# Patient Record
Sex: Male | Born: 1955 | Race: Black or African American | Hispanic: No | Marital: Married | Smoking: Never smoker
Health system: Southern US, Community
[De-identification: ages and names within clinical notes are randomized; demographics above are authoritative.]

---

## 2018-08-26 ENCOUNTER — Emergency Department (HOSPITAL_COMMUNITY): Payer: Self-pay

## 2018-08-26 ENCOUNTER — Inpatient Hospital Stay (HOSPITAL_COMMUNITY): Payer: Self-pay

## 2018-08-26 ENCOUNTER — Inpatient Hospital Stay (HOSPITAL_COMMUNITY)
Admission: EM | Admit: 2018-08-26 | Discharge: 2018-08-27 | DRG: 101 | Disposition: A | Discharge status: Home / Self Care | Payer: Self-pay | Attending: Family Medicine | Admitting: Family Medicine

## 2018-08-26 DIAGNOSIS — F09 Unspecified mental disorder due to known physiological condition: Secondary | ICD-10-CM | POA: Diagnosis present

## 2018-08-26 DIAGNOSIS — Z87898 Personal history of other specified conditions: Secondary | ICD-10-CM

## 2018-08-26 DIAGNOSIS — G9389 Other specified disorders of brain: Secondary | ICD-10-CM | POA: Diagnosis present

## 2018-08-26 DIAGNOSIS — R569 Unspecified convulsions: Secondary | ICD-10-CM

## 2018-08-26 DIAGNOSIS — G40909 Epilepsy, unspecified, not intractable, without status epilepticus: Secondary | ICD-10-CM

## 2018-08-26 DIAGNOSIS — G9349 Other encephalopathy: Secondary | ICD-10-CM | POA: Diagnosis present

## 2018-08-26 DIAGNOSIS — U071 COVID-19: Secondary | ICD-10-CM

## 2018-08-26 DIAGNOSIS — Z20828 Contact with and (suspected) exposure to other viral communicable diseases: Secondary | ICD-10-CM | POA: Diagnosis present

## 2018-08-26 DIAGNOSIS — G40409 Other generalized epilepsy and epileptic syndromes, not intractable, without status epilepticus: Principal | ICD-10-CM | POA: Diagnosis present

## 2018-08-26 DIAGNOSIS — W19XXXA Unspecified fall, initial encounter: Secondary | ICD-10-CM | POA: Diagnosis present

## 2018-08-26 DIAGNOSIS — Y92009 Unspecified place in unspecified non-institutional (private) residence as the place of occurrence of the external cause: Secondary | ICD-10-CM

## 2018-08-26 DIAGNOSIS — Z87820 Personal history of traumatic brain injury: Secondary | ICD-10-CM

## 2018-08-26 DIAGNOSIS — N179 Acute kidney failure, unspecified: Secondary | ICD-10-CM | POA: Diagnosis present

## 2018-08-26 LAB — COMPREHENSIVE METABOLIC PANEL
ALT: 18 U/L (ref 0–44)
AST: 36 U/L (ref 15–41)
Albumin: 3.6 g/dL (ref 3.5–5.0)
Alkaline Phosphatase: 47 U/L (ref 38–126)
Anion gap: 18 — ABNORMAL HIGH (ref 5–15)
BUN: 15 mg/dL (ref 6–20)
CO2: 15 mmol/L — ABNORMAL LOW (ref 22–32)
Calcium: 9 mg/dL (ref 8.9–10.3)
Chloride: 109 mmol/L (ref 98–111)
Creatinine, Ser: 1.26 mg/dL — ABNORMAL HIGH (ref 0.61–1.24)
GFR calc Af Amer: >60 mL/min (ref >60)
GFR calc non Af Amer: >60 mL/min (ref >60)
Glucose, Bld: 96 mg/dL (ref 70–99)
Potassium: 3.8 mmol/L (ref 3.5–5.1)
Sodium: 142 mmol/L (ref 135–145)
Total Bilirubin: 1.7 mg/dL — ABNORMAL HIGH (ref 0.3–1.2)
Total Protein: 6.1 g/dL — ABNORMAL LOW (ref 6.5–8.1)

## 2018-08-26 LAB — CBC WITH DIFFERENTIAL/PLATELET
Abs Immature Granulocytes: 0.05 10*3/uL (ref 0.00–0.07)
Basophils Absolute: 0.1 10*3/uL (ref 0.0–0.1)
Basophils Relative: 0 %
Eosinophils Absolute: 0.2 10*3/uL (ref 0.0–0.5)
Eosinophils Relative: 2 %
HCT: 46.5 % (ref 39.0–52.0)
Hemoglobin: 15.2 g/dL (ref 13.0–17.0)
Immature Granulocytes: 0 %
Lymphocytes Relative: 28 %
Lymphs Abs: 3.8 10*3/uL (ref 0.7–4.0)
MCH: 28.6 pg (ref 26.0–34.0)
MCHC: 32.7 g/dL (ref 30.0–36.0)
MCV: 87.6 fL (ref 80.0–100.0)
Monocytes Absolute: 1 10*3/uL (ref 0.1–1.0)
Monocytes Relative: 7 %
Neutro Abs: 8.3 10*3/uL — ABNORMAL HIGH (ref 1.7–7.7)
Neutrophils Relative %: 63 %
Platelets: 230 10*3/uL (ref 150–400)
RBC: 5.31 MIL/uL (ref 4.22–5.81)
RDW: 12.8 % (ref 11.5–15.5)
WBC: 13.4 10*3/uL — ABNORMAL HIGH (ref 4.0–10.5)
nRBC: 0 % (ref 0.0–0.2)

## 2018-08-26 LAB — URINALYSIS, ROUTINE W REFLEX MICROSCOPIC
Bilirubin Urine: NEGATIVE
Glucose, UA: NEGATIVE mg/dL
Ketones, ur: NEGATIVE mg/dL
Leukocytes,Ua: NEGATIVE
Nitrite: NEGATIVE
Protein, ur: NEGATIVE mg/dL
Specific Gravity, Urine: 1.017 (ref 1.005–1.030)
pH: 6 (ref 5.0–8.0)

## 2018-08-26 LAB — RAPID URINE DRUG SCREEN, HOSP PERFORMED
Amphetamines: NOT DETECTED
Barbiturates: NOT DETECTED
Benzodiazepines: NOT DETECTED
Cocaine: NOT DETECTED
Opiates: NOT DETECTED
Tetrahydrocannabinol: NOT DETECTED

## 2018-08-26 LAB — SARS CORONAVIRUS 2 BY RT PCR (HOSPITAL ORDER, PERFORMED IN ~~LOC~~ HOSPITAL LAB): SARS Coronavirus 2: NEGATIVE

## 2018-08-26 LAB — ETHANOL: Alcohol, Ethyl (B): <10 mg/dL (ref <10)

## 2018-08-26 MED ORDER — LORAZEPAM 2 MG/ML IJ SOLN: 2.0000 mg | INTRAMUSCULAR | Status: DC | PRN

## 2018-08-26 MED ORDER — LORAZEPAM 2 MG/ML IJ SOLN
INTRAMUSCULAR | Status: AC
  Administered 2018-08-26: 2 mg
  Filled 2018-08-26: qty 2

## 2018-08-26 MED ORDER — ACETAMINOPHEN 650 MG RE SUPP: 650.0000 mg | RECTAL | Status: DC | PRN

## 2018-08-26 MED ORDER — SODIUM CHLORIDE 0.9 % IV SOLN
INTRAVENOUS | Status: DC
  Administered 2018-08-26: 18:00:00 via INTRAVENOUS

## 2018-08-26 MED ORDER — STROKE: EARLY STAGES OF RECOVERY BOOK
Freq: Once | Status: AC
  Administered 2018-08-26: 22:00:00
  Filled 2018-08-26: qty 1

## 2018-08-26 MED ORDER — ACETAMINOPHEN 325 MG PO TABS: 650.0000 mg | ORAL_TABLET | ORAL | Status: DC | PRN

## 2018-08-26 MED ORDER — LEVETIRACETAM IN NACL 500 MG/100ML IV SOLN
500.0000 mg | Freq: Once | INTRAVENOUS | Status: AC
  Administered 2018-08-26: 500 mg via INTRAVENOUS
  Filled 2018-08-26: qty 100

## 2018-08-26 MED ORDER — LEVETIRACETAM IN NACL 1000 MG/100ML IV SOLN
1000.0000 mg | Freq: Once | INTRAVENOUS | Status: AC
  Administered 2018-08-26: 1000 mg via INTRAVENOUS
  Filled 2018-08-26: qty 100

## 2018-08-26 MED ORDER — LEVETIRACETAM IN NACL 500 MG/100ML IV SOLN: 500.0000 mg | Freq: Two times a day (BID) | INTRAVENOUS | Status: DC

## 2018-08-26 MED ORDER — SODIUM CHLORIDE 0.9 % IV SOLN
750.0000 mg | Freq: Two times a day (BID) | INTRAVENOUS | Status: DC
  Administered 2018-08-26 – 2018-08-27 (×2): 750 mg via INTRAVENOUS
  Filled 2018-08-26 (×3): qty 7.5

## 2018-08-26 MED ORDER — ENOXAPARIN SODIUM 40 MG/0.4ML ~~LOC~~ SOLN
40.0000 mg | SUBCUTANEOUS | Status: DC
  Administered 2018-08-26: 40 mg via SUBCUTANEOUS
  Filled 2018-08-26: qty 0.4

## 2018-08-26 MED ORDER — ACETAMINOPHEN 160 MG/5ML PO SOLN: 650.0000 mg | ORAL | Status: DC | PRN

## 2018-08-26 NOTE — Progress Notes (Addendum)
1625: Patient arrived to room 5W23. Unable to complete assessment due to language barrier. Unable to complete admission due to language barrier. Call to translation services multiple times. Translator unable to hear and or service not answered. Patient states he also speaks [place] but prefers [place].   1630: Unable to complete Neuro Check and NIH.

## 2018-08-26 NOTE — ED Notes (Signed)
Sats dropped to 89% RN in room to find pt having a seizing , 2mg  ativan given , pt rolled to the side and placed on O2

## 2018-08-26 NOTE — ED Notes (Signed)
Pt becoming very agitated when taking away his blanket , MD at bedside assessing patient , once blanket is given back , pt became calm and Is laying in bed calm

## 2018-08-26 NOTE — Progress Notes (Signed)
EEG Completed; Results Pending  
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
## 2018-08-26 NOTE — ED Notes (Signed)
Family states that pt is back to baseline and wants to go home , family had an appointment to go to but left number , pt is resting in bed VSS

## 2018-08-26 NOTE — ED Notes (Signed)
EEG at bedside. Pt requesting tylenol for headache.
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
## 2018-08-26 NOTE — ED Triage Notes (Addendum)
Pt brought in by EMS for c/o possible seizure ; per ems family reported that patient  Carlos Pratt for a walk and after he came back he had a fall in the kitchen , got himself up and laid on the couch and had a "Shaking " episode ; upon ems arrival patient had agonal breathing and assisted ventilations , pt has white sputum in his mouth ; on the way here , patient became post ictal , language barrier present , unknown what language pt speaks , son on way, pt alert and having purposeful movements at this time ; ems vitals : 200 pal. 144 hr afib, cbg 112 , rr 24 98%ra

## 2018-08-26 NOTE — ED Notes (Signed)
Patient is resting comfortably. 

## 2018-08-26 NOTE — Procedures (Signed)
Patient Name: Carlos Pratt  MRN: [id number]  Epilepsy Attending: Lora Havens  Referring Physician/Provider: Dr Amie [place] Date: 08/26/2018 Duration: 26.59 mins  Patient history: 63yo M with new onset seizure-like episode.  EEG ordered to evaluate for seizures  Level of alertness: Sleep  Technical aspects: This EEG study was done with scalp electrodes positioned according to the 10-20 International system of electrode placement. Electrical activity was acquired at a sampling rate of 500Hz  and reviewed with a high frequency filter of 70Hz  and a low frequency filter of 1Hz . EEG data were recorded continuously and digitally stored.   Description: Patient was asleep during this study.  Sleep was characterized by vertex waves, sleep spindles (12 to 14 Hz) maximal frontocentral.  Sharp cramping in left frontocentral region was noted.  Hyperventilation and photic stimulation were not performed.  IMPRESSION: This study during sleep is within normal limits. No seizures or epileptiform discharges were seen throughout the recording.  If suspicion for interictal activity remains a concern, a prolonged study including sleep should be considered.   [name] Barbra Sarks
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
## 2018-08-27 ENCOUNTER — Encounter (HOSPITAL_COMMUNITY): Payer: Self-pay

## 2018-08-27 DIAGNOSIS — F09 Unspecified mental disorder due to known physiological condition: Secondary | ICD-10-CM

## 2018-08-27 LAB — BASIC METABOLIC PANEL
Anion gap: 8 (ref 5–15)
BUN: 13 mg/dL (ref 6–20)
CO2: 22 mmol/L (ref 22–32)
Calcium: 8.4 mg/dL — ABNORMAL LOW (ref 8.9–10.3)
Chloride: 108 mmol/L (ref 98–111)
Creatinine, Ser: 0.86 mg/dL (ref 0.61–1.24)
GFR calc Af Amer: >60 mL/min (ref >60)
GFR calc non Af Amer: >60 mL/min (ref >60)
Glucose, Bld: 85 mg/dL (ref 70–99)
Potassium: 3.5 mmol/L (ref 3.5–5.1)
Sodium: 138 mmol/L (ref 135–145)

## 2018-08-27 LAB — CBC WITH DIFFERENTIAL/PLATELET
Abs Immature Granulocytes: 0.02 10*3/uL (ref 0.00–0.07)
Basophils Absolute: 0 10*3/uL (ref 0.0–0.1)
Basophils Relative: 1 %
Eosinophils Absolute: 0.3 10*3/uL (ref 0.0–0.5)
Eosinophils Relative: 4 %
HCT: 40.7 % (ref 39.0–52.0)
Hemoglobin: 13.7 g/dL (ref 13.0–17.0)
Immature Granulocytes: 0 %
Lymphocytes Relative: 44 %
Lymphs Abs: 2.7 10*3/uL (ref 0.7–4.0)
MCH: 28.4 pg (ref 26.0–34.0)
MCHC: 33.7 g/dL (ref 30.0–36.0)
MCV: 84.4 fL (ref 80.0–100.0)
Monocytes Absolute: 0.6 10*3/uL (ref 0.1–1.0)
Monocytes Relative: 9 %
Neutro Abs: 2.6 10*3/uL (ref 1.7–7.7)
Neutrophils Relative %: 42 %
Platelets: 143 10*3/uL — ABNORMAL LOW (ref 150–400)
RBC: 4.82 MIL/uL (ref 4.22–5.81)
RDW: 12.7 % (ref 11.5–15.5)
WBC: 6.2 10*3/uL (ref 4.0–10.5)
nRBC: 0 % (ref 0.0–0.2)

## 2018-08-27 LAB — HEMOGLOBIN A1C
Hgb A1c MFr Bld: 4.8 % (ref 4.8–5.6)
Mean Plasma Glucose: 91.06 mg/dL

## 2018-08-27 LAB — LIPID PANEL
Cholesterol: 128 mg/dL (ref 0–200)
HDL: 57 mg/dL (ref >40)
LDL Cholesterol: 56 mg/dL (ref 0–99)
Total CHOL/HDL Ratio: 2.2 RATIO
Triglycerides: 76 mg/dL (ref <150)
VLDL: 15 mg/dL (ref 0–40)

## 2018-08-27 LAB — HIV ANTIBODY (ROUTINE TESTING W REFLEX): HIV Screen 4th Generation wRfx: NONREACTIVE

## 2018-08-27 LAB — MAGNESIUM: Magnesium: 1.9 mg/dL (ref 1.7–2.4)

## 2018-08-27 MED ORDER — IBUPROFEN 400 MG PO TABS
400.0000 mg | ORAL_TABLET | Freq: Once | ORAL | Status: AC
  Administered 2018-08-27: 400 mg via ORAL
  Filled 2018-08-27: qty 1

## 2018-08-27 MED ORDER — LEVETIRACETAM 750 MG PO TABS: 750.0000 mg | ORAL_TABLET | Freq: Two times a day (BID) | ORAL | 0 refills | Status: DC

## 2018-08-27 NOTE — Evaluation (Signed)
Physical Therapy Evaluation Patient Details Name: Carlos Pratt MRN: [id number] DOB: 1955-05-21 Today's Date: 08/27/2018   History of Present Illness  63 y.o. male admitted on 08/26/18 for seizure and fall.  Pt dx with seizure, AKI, acute encephalopathy.  Pt with no other significant PMH on file.   Clinical Impression  Pt is mildly unsteady on his feet, but improved with increased gait distance.  Seated MMT was normal, balance was mildly decreased for his age when tested in standing, but I anticipate will improve quickly when allowed to mobilize more freely.  Son to take him home later today.  Pt is self reporting feeling close to baseline.  Remote interpreter used.   PT to follow acutely for deficits listed below.      Follow Up Recommendations No PT follow up    Equipment Recommendations  None recommended by PT    Recommendations for Other Services   NA    Precautions / Restrictions Precautions Precautions: Fall      Mobility  Bed Mobility Overal bed mobility: Modified Independent                Transfers Overall transfer level: Independent                  Ambulation/Gait Ambulation/Gait assistance: Min guard Gait Distance (Feet): 300 Feet Assistive device: None Gait Pattern/deviations: Step-through pattern;Staggering left;Staggering right     General Gait Details: Pt with mildly staggering gait pattern, which improved with increased distance.          Balance Overall balance assessment: Needs assistance Sitting-balance support: Feet supported;No upper extremity supported Sitting balance-Leahy Scale: Good     Standing balance support: No upper extremity supported Standing balance-Leahy Scale: Good   Single Leg Stance - Right Leg: 10 Single Leg Stance - Left Leg: 10 Tandem Stance - Right Leg: 30 Tandem Stance - Left Leg: 30       High Level Balance Comments: Pt needed min guard assist for SLS, supervision for tandem.              Pertinent Vitals/Pain Pain Assessment: No/denies pain    Home Living Family/patient expects to be discharged to:: Private residence Living Arrangements: Children Available Help at Discharge: Family(son present today) Type of Home: Apartment                Prior Function Level of Independence: Independent         Comments: works in a [employer], physiological facility ([employer])        Extremity/Trunk Assessment   Upper Extremity Assessment Upper Extremity Assessment: Overall WFL for tasks assessed    Lower Extremity Assessment Lower Extremity Assessment: Overall WFL for tasks assessed    Cervical / Trunk Assessment Cervical / Trunk Assessment: Normal  Communication   Communication: Prefers language other than English([name])  Cognition Arousal/Alertness: Awake/alert Behavior During Therapy: WFL for tasks assessed/performed Overall Cognitive Status: Within Functional Limits for tasks assessed                                               Assessment/Plan    PT Assessment Patient needs continued PT services  PT Problem List Decreased balance       PT Treatment Interventions DME instruction;Gait training;Stair training;Functional mobility training;Therapeutic activities;Therapeutic exercise;Balance training;Neuromuscular re-education;Cognitive remediation;Patient/family education    PT Goals (Current goals can be found in  the Care Plan section)  Acute Rehab PT Goals Patient Stated Goal: to go home today if able PT Goal Formulation: With patient Time For Goal Achievement: 09/10/18 Potential to Achieve Goals: Good    Frequency Min 3X/week           AM-PAC PT "6 Clicks" Mobility  Outcome Measure Help needed turning from your back to your side while in a flat bed without using bedrails?: None Help needed moving from lying on your back to sitting on the side of a flat bed without using bedrails?: None Help needed moving to and from a bed  to a chair (including a wheelchair)?: None Help needed standing up from a chair using your arms (e.g., wheelchair or bedside chair)?: None Help needed to walk in hospital room?: A Little Help needed climbing 3-5 steps with a railing? : A Little 6 Click Score: 22    End of Session   Activity Tolerance: Patient tolerated treatment well Patient left: in bed;with call bell/phone within reach;with bed alarm set   PT Visit Diagnosis: Muscle weakness (generalized) (M62.81);Difficulty in walking, not elsewhere classified (R26.2);Other symptoms and signs involving the nervous system (O83.254)    Time: 9826-4158 PT Time Calculation (min) (ACUTE ONLY): 29 min   Charges:     Wells Guiles B. Dinora Hemm, PT, DPT  Acute Rehabilitation [phone] pager [phone]) [phone] office  @ [facility]: [phone]   PT Evaluation $PT Eval Low Complexity: 1 Low PT Treatments $Gait Training: 8-22 mins       08/27/2018, 5:45 PM

## 2018-08-27 NOTE — Progress Notes (Signed)
08/27/2018 PT evaluation complete full note to follow.  Pt is mildly unsteady on his feet, but became more balanced with increased gait distance.  He did not have any asymmetrical weakness and participated in balance activities with me with expected difficulty for being in the bed for a day.  Full note to follow.  No f/u PT needed.  Pt is safe to go home with his son's supervision.  Barbarann Ehlers Lenda Baratta, PT, DPT  Acute Rehabilitation [phone] pager [phone]) [phone] office  @ [facility]: [phone]

## 2018-08-27 NOTE — Progress Notes (Signed)
Nsg Discharge Note  Admit Date:  08/26/2018 Discharge date: 08/27/2018   Carlos Pratt to be D/C'd Home per MD order.  AVS completed.  Copy for chart, and copy for patient signed, and dated. Patient/caregiver able to verbalize understanding.  Discharge Medication: Allergies as of 08/27/2018   Not on File     Medication List    TAKE these medications   levETIRAcetam 750 MG tablet Commonly known as: Keppra Take 1 tablet (750 mg total) by mouth 2 (two) times daily.       Discharge Assessment: Vitals:   08/27/18 0422 08/27/18 1201  BP: 120/83 101/89  Pulse: 70 89  Resp:  16  Temp: 98.4 F (36.9 C) 98.4 F (36.9 C)  SpO2: 100% 100%   Skin clean, dry and intact without evidence of skin break down, no evidence of skin tears noted. IV catheter discontinued intact. Site without signs and symptoms of complications - no redness or edema noted at insertion site, patient denies c/o pain - only slight tenderness at site.  Dressing with slight pressure applied.  D/c Instructions-Education: Discharge instructions given to patient/family with verbalized understanding. D/c education completed with patient/family including follow up instructions, medication list, d/c activities limitations if indicated, with other d/c instructions as indicated by MD - patient able to verbalize understanding, all questions fully answered. Patient instructed to return to ED, call 911, or call MD for any changes in condition.  Patient escorted via [place], and D/C home via private auto.  Tresa Endo, RN 08/27/2018 5:00 PM
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
## 2018-08-27 NOTE — Progress Notes (Signed)
OT Cancellation Note  Patient Details Name: Carlos Pratt MRN: [id number] DOB: 12/28/1955   Cancelled Treatment:    Reason Eval/Treat Not Completed: Other (comment). Pt wanted to finish eating his lunch. OT will check back later as available  Britt Bottom 08/27/2018, 2:48 PM
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
## 2018-08-27 NOTE — Progress Notes (Addendum)
CSW received consult regarding need for medication assistance (Keppra). RNCM arranging [place] letter through [facility] prior to discharge. CSW arranged follow up appointment at [place]. Son aware.  Percell Locus Leticia Coletta LCSW [phone]
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
## 2018-08-27 NOTE — Discharge Instructions (Signed)
Seizure, Adult A seizure is a sudden burst of abnormal electrical activity in the brain. Seizures usually last from 30 seconds to 2 minutes. They can cause many different symptoms. Usually, seizures are not harmful unless they last a long time. What are the causes? Common causes of this condition include:  Fever or infection.  Conditions that affect the brain, such as: ? A brain abnormality that you were born with. ? A brain or head injury. ? Bleeding in the brain. ? A tumor. ? Stroke. ? Brain disorders such as autism or cerebral palsy.  Low blood sugar.  Conditions that are passed from parent to child (are inherited).  Problems with substances, such as: ? Having a reaction to a drug or a medicine. ? Suddenly stopping the use of a substance (withdrawal). In some cases, the cause may not be known. A person who has repeated seizures over time without a clear cause has a condition called epilepsy. What increases the risk? You are more likely to get this condition if you have:  A family history of epilepsy.  Had a seizure in the past.  A brain disorder.  A history of head injury, lack of oxygen at birth, or strokes. What are the signs or symptoms? There are many types of seizures. The symptoms vary depending on the type of seizure you have. Examples of symptoms during a seizure include:  Shaking (convulsions).  Stiffness in the body.  Passing out (losing consciousness).  Head nodding.  Staring.  Not responding to sound or touch.  Loss of bladder control and bowel control. Some people have symptoms right before and right after a seizure happens. Symptoms before a seizure may include:  Fear.  Worry (anxiety).  Feeling like you may vomit (nauseous).  Feeling like the room is spinning (vertigo).  Feeling like you saw or heard something before (dj vu).  Odd tastes or smells.  Changes in how you see. You may see flashing lights or spots. Symptoms after a  seizure happens can include:  Confusion.  Sleepiness.  Headache.  Weakness on one side of the body. How is this treated? Most seizures will stop on their own in under 5 minutes. In these cases, no treatment is needed. Seizures that last longer than 5 minutes will usually need treatment. Treatment can include:  Medicines given through an IV tube.  Avoiding things that are known to cause your seizures. These can include medicines that you take for another condition.  Medicines to treat epilepsy.  Surgery to stop the seizures. This may be needed if medicines do not help. Follow these instructions at home: Medicines  Take over-the-counter and prescription medicines only as told by your doctor.  Do not eat or drink anything that may keep your medicine from working, such as alcohol. Activity  Do not do any activities that would be dangerous if you had another seizure, like driving or swimming. Wait until your doctor says it is safe for you to do them.  If you live in the [place] ask your local DMV (department of motor vehicles) when you can drive.  Get plenty of rest. Teaching others Teach friends and family what to do when you have a seizure. They should:  Lay you on the ground.  Protect your head and body.  Loosen any tight clothing around your neck.  Turn you on your side.  Not hold you down.  Not put anything into your mouth.  Know whether or not you need emergency care.  Stay   with you until you are better.  General instructions  Contact your doctor each time you have a seizure.  Avoid anything that gives you seizures.  Keep a seizure diary. Write down: ? What you think caused each seizure. ? What you remember about each seizure.  Keep all follow-up visits as told by your doctor. This is important. Contact a doctor if:  You have another seizure.  You have seizures more often.  There is any change in what happens during your seizures.  You keep having  seizures with treatment.  You have symptoms of being sick or having an infection. Get help right away if:  You have a seizure that: ? Lasts longer than 5 minutes. ? Is different than seizures you had before. ? Makes it harder to breathe. ? Happens after you hurt your head.  You have any of these symptoms after a seizure: ? Not being able to speak. ? Not being able to use a part of your body. ? Confusion. ? A bad headache.  You have two or more seizures in a row.  You do not wake up right after a seizure.  You get hurt during a seizure. These symptoms may be an emergency. Do not wait to see if the symptoms will go away. Get medical help right away. Call your local emergency services (911 in the [place] Do not drive yourself to the hospital. Summary  Seizures usually last from 30 seconds to 2 minutes. Usually, they are not harmful unless they last a long time.  Do not eat or drink anything that may keep your medicine from working, such as alcohol.  Teach friends and family what to do when you have a seizure.  Contact your doctor each time you have a seizure. This information is not intended to replace advice given to you by your health care provider. Make sure you discuss any questions you have with your health care provider. Document Released: 06/19/2007 Document Revised: 03/20/2018 Document Reviewed: 03/20/2018 Elsevier Patient Education  2020 [employer]  

## 2018-09-18 ENCOUNTER — Inpatient Hospital Stay: Payer: Self-pay | Admitting: Internal Medicine

## 2018-11-18 ENCOUNTER — Other Ambulatory Visit: Payer: Self-pay | Admitting: Critical Care Medicine

## 2018-11-18 ENCOUNTER — Encounter: Payer: Self-pay | Admitting: Critical Care Medicine

## 2018-11-18 DIAGNOSIS — R7309 Other abnormal glucose: Secondary | ICD-10-CM

## 2018-11-18 LAB — GLUCOSE, POCT (MANUAL RESULT ENTRY): POC Glucose: 88 mg/dl (ref 70–99)

## 2018-11-18 MED ORDER — GABAPENTIN 300 MG PO CAPS: 300.0000 mg | ORAL_CAPSULE | Freq: Three times a day (TID) | ORAL | 0 refills | Status: DC

## 2018-11-18 MED ORDER — LEVETIRACETAM 750 MG PO TABS: 750.0000 mg | ORAL_TABLET | Freq: Two times a day (BID) | ORAL | 0 refills | Status: DC

## 2018-11-18 NOTE — [facility] Nurse Program (Signed)
  Dept: [facility] Nurse Program Note  Date of Encounter: 11/18/2018  Past Medical History: No past medical history on file.  Encounter Details: CNP Questionnaire - 11/18/18 1110      Questionnaire   Transportation  Yes, need transportation assistance     Mr. Kepple came in with compliants of body aches and poor eye sight to the right eye. He speaks [place] and no [place], used Museum/gallery conservator as the Optometrist. He was seen at ER on Aug 12th 2020, with a seizure. Admitted and discharged on [place]. Unfortunately, he did not understand the discharge instruction and therefore missed his follow-up appointment to see on September 4th, 2020. He has not taken Keppra since September 12th, 2020. I have educated him on medication compliance and I have set up an appointment for him to been today by Dr. Joya Gaskins at [facility]. I have also set up an appointment with [facility] for December 17th @ 3:30pm. I have also arranged transportation to and from the doctors appt.  Honor Loh, RN, BSN, [place], [place]  [phone]
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
## 2018-11-18 NOTE — Progress Notes (Unsigned)
Seizure med refilled  Rx for nerve pain : Gabapentin   Needs OV 11/18 with Carlos Pratt

## 2018-11-19 ENCOUNTER — Telehealth: Payer: Self-pay

## 2018-11-19 NOTE — Telephone Encounter (Signed)
I have called [facility] and arranged a ride to and from [name]. Earlie Server Muhoro Rn BSn PCCn  [phone]

## 2018-11-19 NOTE — Telephone Encounter (Signed)
Patient called me unsure about appointment for today at [facility]. I have confirmed the [facility] nurse Dawn that the patient is supposed to go there today at 2pm and pick up medication. I have relayed this information to the patient and he verbalizes understanding Honor Loh RN BSN PCCN  [phone].
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
## 2018-12-02 ENCOUNTER — Encounter: Payer: Self-pay | Admitting: Critical Care Medicine

## 2018-12-02 ENCOUNTER — Other Ambulatory Visit: Payer: Self-pay

## 2018-12-02 ENCOUNTER — Ambulatory Visit: Payer: Self-pay | Attending: Critical Care Medicine | Admitting: Critical Care Medicine

## 2018-12-02 VITALS — BP 165/78 | HR 57 | Temp 97.7°F | Ht 68.5 in | Wt 153.0 lb

## 2018-12-02 DIAGNOSIS — R569 Unspecified convulsions: Secondary | ICD-10-CM

## 2018-12-02 DIAGNOSIS — R911 Solitary pulmonary nodule: Secondary | ICD-10-CM | POA: Insufficient documentation

## 2018-12-02 DIAGNOSIS — R1310 Dysphagia, unspecified: Secondary | ICD-10-CM

## 2018-12-02 DIAGNOSIS — K219 Gastro-esophageal reflux disease without esophagitis: Secondary | ICD-10-CM | POA: Insufficient documentation

## 2018-12-02 DIAGNOSIS — R350 Frequency of micturition: Secondary | ICD-10-CM

## 2018-12-02 DIAGNOSIS — R3915 Urgency of urination: Secondary | ICD-10-CM

## 2018-12-02 DIAGNOSIS — R079 Chest pain, unspecified: Secondary | ICD-10-CM

## 2018-12-02 DIAGNOSIS — R1319 Other dysphagia: Secondary | ICD-10-CM

## 2018-12-02 DIAGNOSIS — N401 Enlarged prostate with lower urinary tract symptoms: Secondary | ICD-10-CM

## 2018-12-02 DIAGNOSIS — R1032 Left lower quadrant pain: Secondary | ICD-10-CM

## 2018-12-02 DIAGNOSIS — Z1159 Encounter for screening for other viral diseases: Secondary | ICD-10-CM

## 2018-12-02 DIAGNOSIS — W3400XA Accidental discharge from unspecified firearms or gun, initial encounter: Secondary | ICD-10-CM

## 2018-12-02 MED ORDER — LEVETIRACETAM 750 MG PO TABS: 750.0000 mg | ORAL_TABLET | Freq: Two times a day (BID) | ORAL | 3 refills | Status: DC

## 2018-12-02 MED ORDER — GABAPENTIN 300 MG PO CAPS: 300.0000 mg | ORAL_CAPSULE | Freq: Three times a day (TID) | ORAL | 3 refills | Status: DC

## 2018-12-02 MED ORDER — TAMSULOSIN HCL 0.4 MG PO CAPS: 0.4000 mg | ORAL_CAPSULE | Freq: Every day | ORAL | 3 refills | Status: DC

## 2018-12-02 NOTE — Patient Instructions (Signed)
CT scan of the chest and abdomen will be obtained  An upper GI swallowing study will be obtained  Begin tamsulosin 1 daily to help with urination  We will obtain a urine sample and blood work today  Continue Keppra and gabapentin as prescribed refill sent to our pharmacy here  Return to see Dr. Joya Gaskins 1 month  Financial paperwork given so we can facilitate further referrals for you
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
## 2018-12-02 NOTE — Assessment & Plan Note (Signed)
Will obtain upper GI and no medication changes for now

## 2018-12-02 NOTE — Progress Notes (Signed)
Pt. Stated when he eats, the food keeps coming back up.

## 2018-12-02 NOTE — Assessment & Plan Note (Signed)
Significant esophageal dysphagia with evidence of reflux as well  We will obtain upper GI barium swallow
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
## 2018-12-04 LAB — URINALYSIS
Bilirubin, UA: NEGATIVE
Glucose, UA: NEGATIVE
Ketones, UA: NEGATIVE
Leukocytes,UA: NEGATIVE
Nitrite, UA: NEGATIVE
Protein,UA: NEGATIVE
RBC, UA: NEGATIVE
Specific Gravity, UA: 1.009 (ref 1.005–1.030)
Urobilinogen, Ur: 0.2 mg/dL (ref 0.2–1.0)
pH, UA: 7.5 (ref 5.0–7.5)

## 2018-12-04 LAB — HEPATITIS C ANTIBODY: Hep C Virus Ab: <0.1 s/co ratio (ref 0.0–0.9)

## 2018-12-04 LAB — COMPREHENSIVE METABOLIC PANEL
ALT: 18 IU/L (ref 0–44)
AST: 21 IU/L (ref 0–40)
Albumin/Globulin Ratio: 2.1 (ref 1.2–2.2)
Albumin: 4.7 g/dL (ref 3.8–4.9)
Alkaline Phosphatase: 59 IU/L (ref 39–117)
BUN/Creatinine Ratio: 13 (ref 10–24)
BUN: 10 mg/dL (ref 8–27)
Bilirubin Total: 2.2 mg/dL — ABNORMAL HIGH (ref 0.0–1.2)
CO2: 27 mmol/L (ref 20–29)
Calcium: 9.7 mg/dL (ref 8.6–10.2)
Chloride: 105 mmol/L (ref 96–106)
Creatinine, Ser: 0.79 mg/dL (ref 0.76–1.27)
GFR calc Af Amer: 113 mL/min/{1.73_m2} (ref >59)
GFR calc non Af Amer: 98 mL/min/{1.73_m2} (ref >59)
Globulin, Total: 2.2 g/dL (ref 1.5–4.5)
Glucose: 84 mg/dL (ref 65–99)
Potassium: 4.1 mmol/L (ref 3.5–5.2)
Sodium: 144 mmol/L (ref 134–144)
Total Protein: 6.9 g/dL (ref 6.0–8.5)

## 2018-12-04 LAB — LEVETIRACETAM LEVEL: Levetiracetam Lvl: 17.8 ug/mL (ref 10.0–40.0)

## 2018-12-09 ENCOUNTER — Telehealth: Payer: Self-pay | Admitting: *Deleted

## 2018-12-09 NOTE — Telephone Encounter (Signed)
-----   Message from Elsie Stain, MD sent at 12/04/2018 12:28 PM EST ----- Can you let the pt know his keppra level is therapeutic, stay on current dose of keppra   Waiting on imaging studies

## 2018-12-09 NOTE — Telephone Encounter (Signed)
Patient verified DOB Patient staying on current dose and keeping appt 12/17 at 3:30

## 2018-12-31 ENCOUNTER — Ambulatory Visit: Payer: Self-pay | Admitting: Family Medicine

## 2019-01-05 ENCOUNTER — Ambulatory Visit: Payer: Self-pay | Admitting: Critical Care Medicine
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
## 2019-01-13 ENCOUNTER — Ambulatory Visit: Payer: Self-pay | Attending: Critical Care Medicine | Admitting: Critical Care Medicine

## 2019-01-13 ENCOUNTER — Encounter: Payer: Self-pay | Admitting: Critical Care Medicine

## 2019-01-13 ENCOUNTER — Other Ambulatory Visit: Payer: Self-pay

## 2019-01-13 DIAGNOSIS — R1032 Left lower quadrant pain: Secondary | ICD-10-CM

## 2019-01-13 DIAGNOSIS — R1310 Dysphagia, unspecified: Secondary | ICD-10-CM

## 2019-01-13 DIAGNOSIS — R1319 Other dysphagia: Secondary | ICD-10-CM

## 2019-01-13 DIAGNOSIS — K219 Gastro-esophageal reflux disease without esophagitis: Secondary | ICD-10-CM

## 2019-01-13 DIAGNOSIS — R350 Frequency of micturition: Secondary | ICD-10-CM

## 2019-01-13 DIAGNOSIS — W3400XA Accidental discharge from unspecified firearms or gun, initial encounter: Secondary | ICD-10-CM

## 2019-01-13 DIAGNOSIS — Z20822 Contact with and (suspected) exposure to covid-19: Secondary | ICD-10-CM

## 2019-01-13 DIAGNOSIS — R569 Unspecified convulsions: Secondary | ICD-10-CM

## 2019-01-13 DIAGNOSIS — R911 Solitary pulmonary nodule: Secondary | ICD-10-CM

## 2019-01-13 DIAGNOSIS — R3915 Urgency of urination: Secondary | ICD-10-CM

## 2019-01-13 DIAGNOSIS — N401 Enlarged prostate with lower urinary tract symptoms: Secondary | ICD-10-CM

## 2019-01-13 DIAGNOSIS — Z20828 Contact with and (suspected) exposure to other viral communicable diseases: Secondary | ICD-10-CM

## 2019-01-13 MED ORDER — GABAPENTIN 300 MG PO CAPS: 300.0000 mg | ORAL_CAPSULE | Freq: Three times a day (TID) | ORAL | 3 refills | Status: DC
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
## 2019-01-13 NOTE — Assessment & Plan Note (Signed)
Await esophagram

## 2019-01-13 NOTE — Assessment & Plan Note (Signed)
History of lung nodule on CT Will get follow-up CT scanning

## 2019-01-13 NOTE — Assessment & Plan Note (Signed)
Benign prostatic hypertrophy with urinary frequency but did not respond to Flomax  I would await to see what the abdominal CT scan shows and then will likely refer to urology  Discontinue Flomax

## 2019-01-13 NOTE — Assessment & Plan Note (Signed)
I would like for neurology evaluation in this patient at at the same time will discontinue Keppra for now

## 2019-01-13 NOTE — Assessment & Plan Note (Addendum)
Awaiting follow-up CT scan for further evaluation of left lower quadrant abdominal pain  Will refill gabapentin

## 2019-01-19 ENCOUNTER — Ambulatory Visit (HOSPITAL_COMMUNITY)
Admission: RE | Admit: 2019-01-19 | Discharge: 2019-01-19 | Disposition: A | Discharge status: Home / Self Care | Payer: Medicaid Other | Source: Ambulatory Visit | Attending: Critical Care Medicine | Admitting: Critical Care Medicine

## 2019-01-19 ENCOUNTER — Ambulatory Visit (HOSPITAL_BASED_OUTPATIENT_CLINIC_OR_DEPARTMENT_OTHER): Payer: Medicaid Other | Admitting: Critical Care Medicine

## 2019-01-19 ENCOUNTER — Other Ambulatory Visit: Payer: Self-pay

## 2019-01-19 ENCOUNTER — Encounter: Payer: Self-pay | Admitting: Critical Care Medicine

## 2019-01-19 VITALS — BP 153/91 | HR 56 | Resp 16 | Ht 68.5 in | Wt 151.8 lb

## 2019-01-19 DIAGNOSIS — G40909 Epilepsy, unspecified, not intractable, without status epilepticus: Secondary | ICD-10-CM | POA: Diagnosis not present

## 2019-01-19 DIAGNOSIS — R1032 Left lower quadrant pain: Secondary | ICD-10-CM

## 2019-01-19 DIAGNOSIS — R300 Dysuria: Secondary | ICD-10-CM | POA: Insufficient documentation

## 2019-01-19 DIAGNOSIS — K219 Gastro-esophageal reflux disease without esophagitis: Secondary | ICD-10-CM | POA: Diagnosis not present

## 2019-01-19 DIAGNOSIS — I7090 Unspecified atherosclerosis: Secondary | ICD-10-CM | POA: Diagnosis not present

## 2019-01-19 DIAGNOSIS — R309 Painful micturition, unspecified: Secondary | ICD-10-CM | POA: Insufficient documentation

## 2019-01-19 DIAGNOSIS — R1310 Dysphagia, unspecified: Secondary | ICD-10-CM

## 2019-01-19 DIAGNOSIS — R911 Solitary pulmonary nodule: Secondary | ICD-10-CM

## 2019-01-19 DIAGNOSIS — R1319 Other dysphagia: Secondary | ICD-10-CM

## 2019-01-19 DIAGNOSIS — G894 Chronic pain syndrome: Secondary | ICD-10-CM | POA: Insufficient documentation

## 2019-01-19 DIAGNOSIS — Z79899 Other long term (current) drug therapy: Secondary | ICD-10-CM | POA: Insufficient documentation

## 2019-01-19 DIAGNOSIS — H0510 Unspecified chronic inflammatory disorders of orbit: Secondary | ICD-10-CM | POA: Insufficient documentation

## 2019-01-19 DIAGNOSIS — W3400XA Accidental discharge from unspecified firearms or gun, initial encounter: Secondary | ICD-10-CM

## 2019-01-19 DIAGNOSIS — R569 Unspecified convulsions: Secondary | ICD-10-CM

## 2019-01-19 DIAGNOSIS — H0500 Unspecified acute inflammation of orbit: Secondary | ICD-10-CM

## 2019-01-19 DIAGNOSIS — D1803 Hemangioma of intra-abdominal structures: Secondary | ICD-10-CM | POA: Diagnosis not present

## 2019-01-19 DIAGNOSIS — R079 Chest pain, unspecified: Secondary | ICD-10-CM

## 2019-01-19 MED ORDER — ERYTHROMYCIN 5 MG/GM OP OINT: 1.0000 "application " | TOPICAL_OINTMENT | Freq: Every day | OPHTHALMIC | 0 refills | Status: DC

## 2019-01-19 MED ORDER — IOHEXOL 300 MG/ML  SOLN
100.0000 mL | Freq: Once | INTRAMUSCULAR | Status: AC | PRN
  Administered 2019-01-19: 100 mL via INTRAVENOUS
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
## 2019-01-19 NOTE — Assessment & Plan Note (Signed)
Ongoing esophageal dysphagia with inability keep food down with significant reflux  I have rescheduled the esophagram for 1 week

## 2019-01-19 NOTE — Assessment & Plan Note (Signed)
As per dysphagia assessment

## 2019-01-19 NOTE — Assessment & Plan Note (Signed)
Chronic pain syndrome with left lower quadrant abdominal pain we will follow-up with CT scan of chest and abdomen and refill gabapentin

## 2019-01-19 NOTE — Patient Instructions (Signed)
Please pick up your gabapentin today and also begin erythromycin eye gel half inch amount or small amount in the left eye socket twice daily  Keep your appointment today for your CAT scan  Keep appointment for next week for your esophagram  We will call you with results and another appointment with Dr. Joya Gaskins will be scheduled in 2 weeks
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
## 2019-01-19 NOTE — Assessment & Plan Note (Signed)
Previous history of lung nodule will follow up with CT scan of chest

## 2019-01-19 NOTE — Progress Notes (Signed)
Pt states his pain is also coming from his stomach and back
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
## 2019-01-20 LAB — URINALYSIS, COMPLETE
Bilirubin, UA: NEGATIVE
Glucose, UA: NEGATIVE
Ketones, UA: NEGATIVE
Leukocytes,UA: NEGATIVE
Nitrite, UA: NEGATIVE
Protein,UA: NEGATIVE
RBC, UA: NEGATIVE
Specific Gravity, UA: 1.024 (ref 1.005–1.030)
Urobilinogen, Ur: 0.2 mg/dL (ref 0.2–1.0)
pH, UA: 7.5 (ref 5.0–7.5)

## 2019-01-20 LAB — MICROSCOPIC EXAMINATION
Bacteria, UA: NONE SEEN
Casts: NONE SEEN /lpf

## 2019-01-21 LAB — URINE CULTURE: Organism ID, Bacteria: NO GROWTH

## 2019-01-22 ENCOUNTER — Encounter: Payer: Self-pay | Admitting: Critical Care Medicine

## 2019-01-22 ENCOUNTER — Telehealth: Payer: Self-pay | Admitting: Critical Care Medicine

## 2019-01-22 DIAGNOSIS — K36 Other appendicitis: Secondary | ICD-10-CM

## 2019-01-22 MED ORDER — TRAMADOL HCL 50 MG PO TABS: 50.0000 mg | ORAL_TABLET | Freq: Three times a day (TID) | ORAL | 0 refills | Status: AC | PRN

## 2019-01-22 MED ORDER — AMOXICILLIN-POT CLAVULANATE 875-125 MG PO TABS: 1.0000 | ORAL_TABLET | Freq: Two times a day (BID) | ORAL | 0 refills | Status: DC

## 2019-01-22 NOTE — Telephone Encounter (Signed)
I was able to connect with this patient through [facility] interpreters.  The interpreter was Alani [id number]  I gave this patient his results of his CAT scan of chest and abdomen.  He appears to have chronic appendicitis and as he describes his pain today he clearly is in the right lower quadrant with radiation to the back with some radiation to the left lower quadrant as well.  My plan will be to start this patient on Augmentin twice daily for 10 days and begin tramadol as needed for pain.  As well we will make a referral to general surgery for evaluation.  Note his chest CT was normal.

## 2019-01-25 ENCOUNTER — Other Ambulatory Visit: Payer: Self-pay

## 2019-01-25 ENCOUNTER — Telehealth: Payer: Self-pay

## 2019-01-25 ENCOUNTER — Ambulatory Visit (HOSPITAL_COMMUNITY)
Admission: RE | Admit: 2019-01-25 | Discharge: 2019-01-25 | Disposition: A | Discharge status: Home / Self Care | Payer: Medicaid Other | Source: Ambulatory Visit | Attending: Critical Care Medicine | Admitting: Critical Care Medicine

## 2019-01-25 ENCOUNTER — Ambulatory Visit: Payer: Medicaid Other

## 2019-01-25 DIAGNOSIS — R1310 Dysphagia, unspecified: Secondary | ICD-10-CM | POA: Insufficient documentation

## 2019-01-25 DIAGNOSIS — K219 Gastro-esophageal reflux disease without esophagitis: Secondary | ICD-10-CM

## 2019-01-25 DIAGNOSIS — K36 Other appendicitis: Secondary | ICD-10-CM

## 2019-01-25 DIAGNOSIS — R1319 Other dysphagia: Secondary | ICD-10-CM

## 2019-01-25 NOTE — Telephone Encounter (Signed)
[employer] transportation arranged to take patient home from clinic

## 2019-01-26 ENCOUNTER — Telehealth: Payer: Self-pay | Admitting: Critical Care Medicine

## 2019-01-26 DIAGNOSIS — K257 Chronic gastric ulcer without hemorrhage or perforation: Secondary | ICD-10-CM

## 2019-01-26 DIAGNOSIS — K2100 Gastro-esophageal reflux disease with esophagitis, without bleeding: Secondary | ICD-10-CM | POA: Insufficient documentation

## 2019-01-26 LAB — CBC WITH DIFFERENTIAL/PLATELET
Basophils Absolute: 0.1 10*3/uL (ref 0.0–0.2)
Basos: 1 %
EOS (ABSOLUTE): 0.3 10*3/uL (ref 0.0–0.4)
Eos: 4 %
Hematocrit: 42.3 % (ref 37.5–51.0)
Hemoglobin: 14.7 g/dL (ref 13.0–17.7)
Immature Grans (Abs): 0 10*3/uL (ref 0.0–0.1)
Immature Granulocytes: 0 %
Lymphocytes Absolute: 2.1 10*3/uL (ref 0.7–3.1)
Lymphs: 38 %
MCH: 29.3 pg (ref 26.6–33.0)
MCHC: 34.8 g/dL (ref 31.5–35.7)
MCV: 84 fL (ref 79–97)
Monocytes Absolute: 0.5 10*3/uL (ref 0.1–0.9)
Monocytes: 8 %
Neutrophils Absolute: 2.7 10*3/uL (ref 1.4–7.0)
Neutrophils: 49 %
Platelets: 188 10*3/uL (ref 150–450)
RBC: 5.01 x10E6/uL (ref 4.14–5.80)
RDW: 12.8 % (ref 11.6–15.4)
WBC: 5.6 10*3/uL (ref 3.4–10.8)

## 2019-01-26 MED ORDER — PANTOPRAZOLE SODIUM 40 MG PO TBEC: 40.0000 mg | DELAYED_RELEASE_TABLET | Freq: Two times a day (BID) | ORAL | 3 refills | Status: DC

## 2019-01-26 NOTE — Telephone Encounter (Signed)
I connect with this patient who has an abnormal upper GI which shows likely gastric ulcer and severe gastroesophageal reflux disease.  I plan to refer this patient to gastroenterology and as well begin proton pump inhibitor twice daily

## 2019-02-03 ENCOUNTER — Encounter: Payer: Self-pay | Admitting: Critical Care Medicine
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
## 2019-02-08 ENCOUNTER — Other Ambulatory Visit: Payer: Self-pay

## 2019-02-08 ENCOUNTER — Ambulatory Visit: Payer: Medicaid Other | Attending: Critical Care Medicine | Admitting: Critical Care Medicine

## 2019-02-08 ENCOUNTER — Encounter: Payer: Self-pay | Admitting: Critical Care Medicine

## 2019-02-08 VITALS — BP 144/78 | HR 62 | Ht 68.5 in | Wt 154.0 lb

## 2019-02-08 DIAGNOSIS — R1310 Dysphagia, unspecified: Secondary | ICD-10-CM

## 2019-02-08 DIAGNOSIS — R300 Dysuria: Secondary | ICD-10-CM

## 2019-02-08 DIAGNOSIS — M79604 Pain in right leg: Secondary | ICD-10-CM | POA: Diagnosis not present

## 2019-02-08 DIAGNOSIS — Z8669 Personal history of other diseases of the nervous system and sense organs: Secondary | ICD-10-CM | POA: Insufficient documentation

## 2019-02-08 DIAGNOSIS — Z87898 Personal history of other specified conditions: Secondary | ICD-10-CM

## 2019-02-08 DIAGNOSIS — R1319 Other dysphagia: Secondary | ICD-10-CM

## 2019-02-08 DIAGNOSIS — K219 Gastro-esophageal reflux disease without esophagitis: Secondary | ICD-10-CM

## 2019-02-08 DIAGNOSIS — H5462 Unqualified visual loss, left eye, normal vision right eye: Secondary | ICD-10-CM | POA: Insufficient documentation

## 2019-02-08 DIAGNOSIS — K257 Chronic gastric ulcer without hemorrhage or perforation: Secondary | ICD-10-CM

## 2019-02-08 DIAGNOSIS — G8929 Other chronic pain: Secondary | ICD-10-CM | POA: Diagnosis not present

## 2019-02-08 DIAGNOSIS — Z79899 Other long term (current) drug therapy: Secondary | ICD-10-CM | POA: Diagnosis not present

## 2019-02-08 DIAGNOSIS — K2100 Gastro-esophageal reflux disease with esophagitis, without bleeding: Secondary | ICD-10-CM | POA: Diagnosis not present

## 2019-02-08 DIAGNOSIS — R911 Solitary pulmonary nodule: Secondary | ICD-10-CM

## 2019-02-08 DIAGNOSIS — R1032 Left lower quadrant pain: Secondary | ICD-10-CM | POA: Insufficient documentation

## 2019-02-08 DIAGNOSIS — H0510 Unspecified chronic inflammatory disorders of orbit: Secondary | ICD-10-CM

## 2019-02-08 DIAGNOSIS — H0500 Unspecified acute inflammation of orbit: Secondary | ICD-10-CM

## 2019-02-08 NOTE — Assessment & Plan Note (Signed)
Reflux disease markedly improved  We will continue Protonix twice daily

## 2019-02-08 NOTE — Assessment & Plan Note (Signed)
No nodule seen in the CT scan

## 2019-02-08 NOTE — Progress Notes (Signed)
Patient does not know what medications he is taking

## 2019-02-08 NOTE — Assessment & Plan Note (Signed)
Burning on urination has resolved

## 2019-02-08 NOTE — Assessment & Plan Note (Signed)
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

## 2019-02-08 NOTE — Patient Instructions (Signed)
Stay on Protonix twice daily  Return to see Dr. Joya Gaskins in 2 months  A release note was given to return to work

## 2019-02-08 NOTE — Assessment & Plan Note (Signed)
No further seizure activity will follow off Keppra for now

## 2019-04-12 ENCOUNTER — Ambulatory Visit: Payer: Medicaid Other | Attending: Internal Medicine

## 2019-04-12 DIAGNOSIS — Z23 Encounter for immunization: Secondary | ICD-10-CM
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
## 2019-04-12 NOTE — Progress Notes (Signed)
   Covid-19 Vaccination Clinic  Name:  Carlos Pratt    MRN: [id number] DOB: 04-08-1955  04/12/2019  Mr. Carlos Pratt was observed post Covid-19 immunization for 15 minutes without incident. He was provided with Vaccine Information Sheet and instruction to access the V-Safe system.   Mr. Carlos Pratt was instructed to call 911 with any severe reactions post vaccine: Marland Kitchen Difficulty breathing  . Swelling of face and throat  . A fast heartbeat  . A bad rash all over body  . Dizziness and weakness

## 2019-04-13 ENCOUNTER — Ambulatory Visit: Payer: Medicaid Other | Attending: Critical Care Medicine | Admitting: Critical Care Medicine

## 2019-04-13 ENCOUNTER — Other Ambulatory Visit: Payer: Self-pay

## 2019-04-13 ENCOUNTER — Encounter: Payer: Self-pay | Admitting: Critical Care Medicine

## 2019-04-13 VITALS — BP 125/77 | HR 68 | Temp 98.4°F | Resp 18 | Ht 68.5 in | Wt 150.0 lb

## 2019-04-13 DIAGNOSIS — W3400XA Accidental discharge from unspecified firearms or gun, initial encounter: Secondary | ICD-10-CM

## 2019-04-13 DIAGNOSIS — K2100 Gastro-esophageal reflux disease with esophagitis, without bleeding: Secondary | ICD-10-CM

## 2019-04-13 DIAGNOSIS — Z87828 Personal history of other (healed) physical injury and trauma: Secondary | ICD-10-CM | POA: Insufficient documentation

## 2019-04-13 DIAGNOSIS — R1319 Other dysphagia: Secondary | ICD-10-CM

## 2019-04-13 DIAGNOSIS — R1310 Dysphagia, unspecified: Secondary | ICD-10-CM

## 2019-04-13 DIAGNOSIS — H10403 Unspecified chronic conjunctivitis, bilateral: Secondary | ICD-10-CM

## 2019-04-13 MED ORDER — ERYTHROMYCIN 5 MG/GM OP OINT: 1.0000 "application " | TOPICAL_OINTMENT | Freq: Every day | OPHTHALMIC | 0 refills | Status: DC

## 2019-04-13 MED ORDER — ONDANSETRON HCL 4 MG PO TABS: 4.0000 mg | ORAL_TABLET | Freq: Three times a day (TID) | ORAL | 0 refills | Status: DC | PRN

## 2019-04-13 NOTE — Patient Instructions (Signed)
Resume eye gel to both eyes, 1/2 inch to lower eyelid daily  Zofran 4mg  every 8 hours for nausea  Stay on protonix twice daily  A referral to eye doctor will be made  A referral to gastroenterology will be made   See Dr Joya Gaskins in 3 months
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
## 2019-04-13 NOTE — Assessment & Plan Note (Addendum)
As per dysphagia assessment  I have refilled the Protonix and also will begin Zofran as needed for nausea every 8 hours

## 2019-04-13 NOTE — Assessment & Plan Note (Signed)
Ongoing esophageal dysphagia and also emesis after eating with high level reflux despite use of Protonix twice daily  Will refer to gastroenterology for further evaluation now the patient has Medicaid

## 2019-04-20 ENCOUNTER — Other Ambulatory Visit: Payer: Self-pay

## 2019-04-20 ENCOUNTER — Encounter: Payer: Self-pay | Admitting: Gastroenterology

## 2019-04-20 DIAGNOSIS — R112 Nausea with vomiting, unspecified: Secondary | ICD-10-CM

## 2019-04-20 LAB — GLUCOSE, POCT (MANUAL RESULT ENTRY): POC Glucose: 100 mg/dl — AB (ref 70–99)

## 2019-04-20 NOTE — Congregational Nurse Program (Signed)
  Dept: [facility] Nurse Program Note  Date of Encounter: 04/20/2019  Past Medical History: No past medical history on file.  Encounter Details: Client requested assistance to make an appointment [facility]. Same done for May 4th @1 :40 pm with request to arrive 20 min early for paperwork. I will assist client complete paper work once they arrive via mail. Honor Loh RN BSN PCCN [phone]

## 2019-05-18 ENCOUNTER — Ambulatory Visit: Payer: Medicaid Other | Admitting: Gastroenterology
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
## 2019-05-19 NOTE — Congregational Nurse Program (Signed)
  Dept: [facility] Nurse Program Note  Date of Encounter: 05/19/2019  Past Medical History: No past medical history on file.  Encounter Details: CNP Questionnaire - 05/19/19 1238      Questionnaire   Patient Status  Refugee    Race  African    Location Patient Served At  [place]  Medicaid    Uninsured  Not Applicable    Food  No food insecurities    Housing/Utilities  Yes, have permanent housing    Transportation  Yes, need transportation assistance    Interpersonal Safety  Yes, feel physically and emotionally safe where you currently live    Medication  No medication insecurities    Medical Provider  Yes    Referrals  Not Applicable    ED Visit Averted  Not Applicable    Life-Saving Intervention Made  Not Applicable      Client missed appointment with [facility]. He has requested to be rescheduled. Same done for June 8th @11am .Patient educated on appointment/medical compliance. Earlie Server Colby Catanese RN BSN PCCN [id number]

## 2019-06-22 ENCOUNTER — Ambulatory Visit: Payer: Medicaid Other | Admitting: Gastroenterology

## 2019-10-26 ENCOUNTER — Telehealth: Payer: Self-pay

## 2019-10-26 NOTE — Telephone Encounter (Signed)
ATC pt via [employer] [place], [id number]) to remind of appt tomorrow w/ Dr. Joya Gaskins and to complete COVID screening, no answer, [facility], VM not set up

## 2019-10-27 ENCOUNTER — Ambulatory Visit: Payer: Medicaid Other | Admitting: Critical Care Medicine

## 2019-10-27 DIAGNOSIS — Z87898 Personal history of other specified conditions: Secondary | ICD-10-CM
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
## 2019-11-30 ENCOUNTER — Other Ambulatory Visit: Payer: Self-pay

## 2019-11-30 ENCOUNTER — Encounter: Payer: Self-pay | Admitting: Critical Care Medicine

## 2019-11-30 ENCOUNTER — Ambulatory Visit: Payer: Medicaid Other | Attending: Critical Care Medicine | Admitting: Critical Care Medicine

## 2019-11-30 VITALS — BP 165/89 | HR 61 | Wt 159.4 lb

## 2019-11-30 DIAGNOSIS — H10403 Unspecified chronic conjunctivitis, bilateral: Secondary | ICD-10-CM

## 2019-11-30 DIAGNOSIS — Z1211 Encounter for screening for malignant neoplasm of colon: Secondary | ICD-10-CM | POA: Diagnosis not present

## 2019-11-30 DIAGNOSIS — K2100 Gastro-esophageal reflux disease with esophagitis, without bleeding: Secondary | ICD-10-CM | POA: Diagnosis not present

## 2019-11-30 DIAGNOSIS — R1319 Other dysphagia: Secondary | ICD-10-CM | POA: Diagnosis not present

## 2019-11-30 DIAGNOSIS — R030 Elevated blood-pressure reading, without diagnosis of hypertension: Secondary | ICD-10-CM | POA: Insufficient documentation

## 2019-11-30 MED ORDER — FAMOTIDINE 20 MG PO TABS: 20.0000 mg | ORAL_TABLET | Freq: Two times a day (BID) | ORAL | 6 refills | Status: AC

## 2019-11-30 MED ORDER — ONDANSETRON HCL 4 MG PO TABS: 4.0000 mg | ORAL_TABLET | Freq: Three times a day (TID) | ORAL | 0 refills | Status: AC | PRN

## 2019-11-30 MED ORDER — ERYTHROMYCIN 5 MG/GM OP OINT: 1.0000 "application " | TOPICAL_OINTMENT | Freq: Every day | OPHTHALMIC | 0 refills | Status: AC

## 2019-11-30 NOTE — Assessment & Plan Note (Signed)
Blood pressure has been up and down in the past he was quite agitated today I would like to bring him back for repeat check and hold off on medications

## 2019-11-30 NOTE — Patient Instructions (Addendum)
Please get a Covid booster shot, Carlos Pratt and Carlos Pratt is good.   COVID-19 Vaccine Information can be found at: ShippingScam.co.uk For questions related to vaccine distribution or appointments, please email vaccine@[email or web address] [email or web address] or call [phone].    You declined the flu shot today  Another referral to [facility] was given  Another referral to gastroenterology was made  Take Pepcid 1 twice daily and Zofran as needed for nausea  Labs today include metabolic panel and blood count  Please obtain a Covid booster shot [employer]  Return to see Dr. Joya Gaskins 3 months

## 2019-11-30 NOTE — Assessment & Plan Note (Signed)
Ongoing reflux disease with high-level reflux esophagitis and also evidence of gastric ulceration on upper GI from January 2021  I will attempt to get this patient back into gastroenterology will see if we can get him transportation for this and into person per interpreter I partnered with our nurse case manager for this  Resume Pepcid 20 mg twice daily and Zofran as needed for nausea was given
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
## 2019-11-30 NOTE — Progress Notes (Signed)
Stomach indigestion

## 2019-11-30 NOTE — Assessment & Plan Note (Signed)
Chronic recurrent bacterial conjunctivitis in the right eye and also in the left eye socket  Plan is to resume erythromycin gel for now and try to get this patient back into ophthalmology with transportation and an in person interpreter

## 2019-12-28 ENCOUNTER — Telehealth: Payer: Self-pay

## 2019-12-28 DIAGNOSIS — H10403 Unspecified chronic conjunctivitis, bilateral: Secondary | ICD-10-CM

## 2019-12-28 DIAGNOSIS — H5461 Unqualified visual loss, right eye, normal vision left eye: Secondary | ICD-10-CM

## 2019-12-28 NOTE — Telephone Encounter (Signed)
Another eye referral sent to [facility]

## 2019-12-28 NOTE — Telephone Encounter (Signed)
Per Alinda Sierras [facility] referral specialist, ophthalmology referral was sent to [facility].   They recommend referring the patient to a specialist at [facility], patient does not have transportation.  Call placed to [facility] Transportation services, spoke to Mo and the estimated cost for round trip would be approximately $100.  Call placed to Honor Loh, RN/Congregational Nurse # [phone]  and explained the above. She has not seen patient recently but stated that she could get in touch with him and assist with making appointment at [facility] and arranging transportation through [place].  The patient has managed medicaid. If the insurance will not pay for the transportation, Earlie Server will arrange through the congregational nurse program.    Provided update to Alinda Sierras and gave her Dorothy's phone number to coordinate scheduling appointment.   Patient has appointment at [facility] tomorrow - 12/29/2019

## 2019-12-29 ENCOUNTER — Telehealth: Payer: Self-pay

## 2019-12-29 ENCOUNTER — Ambulatory Visit: Payer: Medicaid Other | Admitting: Critical Care Medicine
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
## 2019-12-29 NOTE — Telephone Encounter (Signed)
Several attempts  to call patient unsuccessfully. I will try again later and probably a home visit if need be. Honor Loh RN BSN PCCN [facility] [phone]

## 2019-12-31 ENCOUNTER — Telehealth: Payer: Self-pay

## 2019-12-31 NOTE — Telephone Encounter (Signed)
I have contacted patient and he has a ride to [facility] .Patient informed that transportation assistance is available if needed. I will call him back with appointment date and time.  Honor Loh RN BSN PCCN  [facility]  [phone]

## 2020-01-02 ENCOUNTER — Telehealth: Payer: Self-pay

## 2020-01-02 NOTE — Telephone Encounter (Signed)
Contacted patient to inform him of scheduled appointment on January 3rd at 2:30pm [facility]. No response. I have texted him the same information in Swahili.  Honor Loh RN BSN PCCN  [facility] [phone]

## 2020-01-18 NOTE — Congregational Nurse Program (Signed)
  Dept: [phone]   Congregational Nurse Program Note  Date of Encounter: 01/12/2020  Past Medical History: No past medical history on file.  Encounter Details:  CNP Questionnaire - 01/18/20 1100      Questionnaire   Do you give verbal consent to treat you today? Yes    Visit Setting Other    Location Patient Served At [facility]    Patient Status Refugee    Medical Provider Yes    Insurance Medicaid    Intervention Educate;Support    Transportation Need transportation assistance          Attempted to call patient to remind him of eye appointment. Call not answered. Text messaged in swahili with date and location of this appointment and that transportation assistance is available. Arman Bogus RN BSN PCCN [facility]  [phone]
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
## 2020-02-21 ENCOUNTER — Ambulatory Visit: Payer: Medicaid Other | Admitting: Critical Care Medicine

## 2020-05-15 ENCOUNTER — Telehealth: Payer: Self-pay

## 2020-05-15 NOTE — Telephone Encounter (Signed)
Reached out to Dorothy from [employer] about the status of the patients upcoming appt. Dorothy shared she would reach out the patient by phone and text message as well.

## 2020-05-15 NOTE — Telephone Encounter (Signed)
I have contacted patient and his son to remind him of appointment scheduled on 05/16/20 @0930am .Calls not answered. Text messaged patient in swahili.  Honor Loh RN [facility]  [facility] [phone]

## 2020-05-16 ENCOUNTER — Telehealth: Payer: Self-pay

## 2020-05-16 ENCOUNTER — Ambulatory Visit: Payer: Medicaid Other | Admitting: Critical Care Medicine

## 2020-05-16 NOTE — Telephone Encounter (Signed)
Contacted Neoma Laming) [facility] Nurse to reschedule appointment for the patient. Neoma Laming contacted patient to make them aware of updated appointment date and time. Interpreter has been scheduled as well.
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
## 2020-05-16 NOTE — Telephone Encounter (Signed)
Patient son returned my call. I  Informed him  that patient missed appointment ealier today. Educated him on  the importance of  compliance with appointments. I will reach out to case management Darliss Cheney to reschedule appointment with interpreter.  Honor Loh RN B[facility]  [facility] [phone]
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
## 2020-05-17 ENCOUNTER — Other Ambulatory Visit: Payer: Self-pay

## 2020-05-17 ENCOUNTER — Ambulatory Visit: Payer: Medicaid Other | Admitting: Critical Care Medicine

## 2020-05-17 ENCOUNTER — Telehealth: Payer: Self-pay

## 2020-05-17 NOTE — Telephone Encounter (Signed)
Contacted congregational nurse if the patient is in route to today's in person office visit. Congregational Nurse is checking with the patient.

## 2020-05-17 NOTE — Telephone Encounter (Signed)
Received a call from case manager McCoy that patient did not show up for today`s appointment. Patient did not pick up my call.I was able to speak with his son Shanon Brow. Patient son was unable to reach him as well. Patient son stated that his father was aware of today`s appointment.  Honor Loh RN BSn P[facility]  [facility] Nurse [phone]

## 2020-10-21 IMAGING — CT CT CHEST W/ CM
2 of 9 series · 11 of 46 positions shown, 16 images · IV contrast (APPLIED)
Comparison: None.

CLINICAL DATA: 63-year-old male with history of left lower quadrant
abdominal pain and difficulty urinating.

EXAM:
CT CHEST, ABDOMEN, AND PELVIS WITH CONTRAST
TECHNIQUE: Multidetector CT imaging of the chest, abdomen and pelvis was
performed following the standard protocol during bolus
administration of intravenous contrast.
CONTRAST:  100mL OMNIPAQUE IOHEXOL 300 MG/ML  SOLN

[Series 5: coronal · coronal · 0.69mm/px · 3 of 143 slices shown]
[im 36/143  soft-tissue]
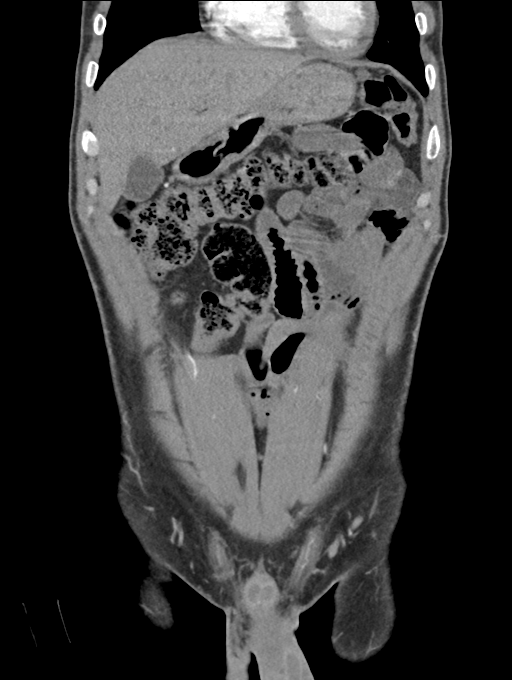
[im 72/143  soft-tissue]
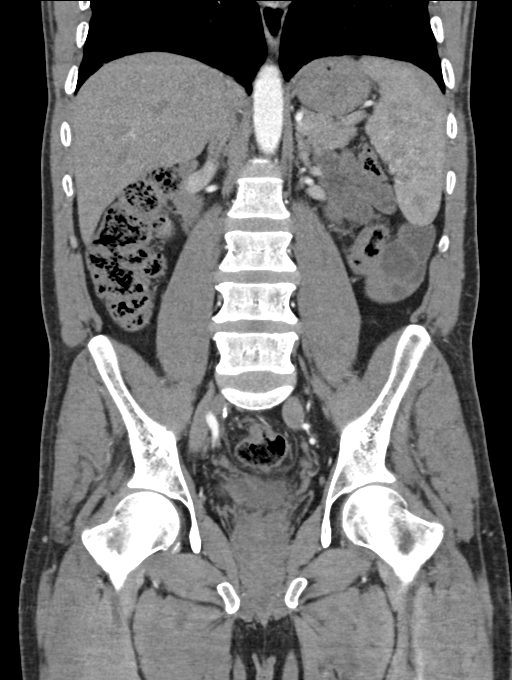
[im 107/143  soft-tissue]
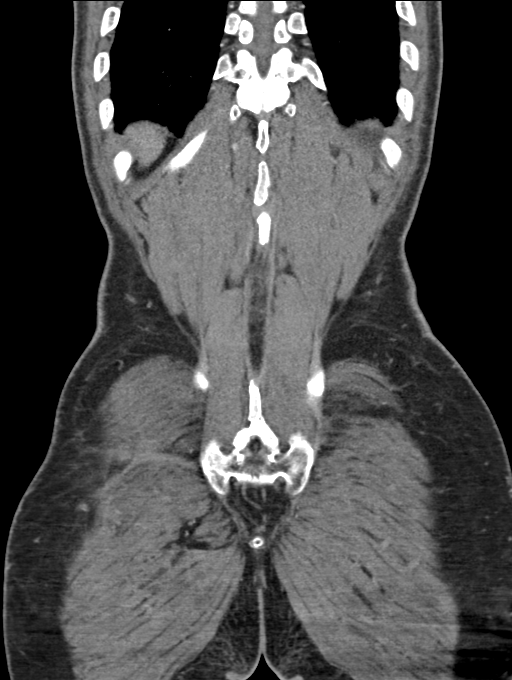

[Series 7: cap 5.0 i31f 2 · axial · 0.75mm/px · z∈[+792,+1318]mm · 8 of 137 slices shown, 13 images]
[im 16/137  soft-tissue]
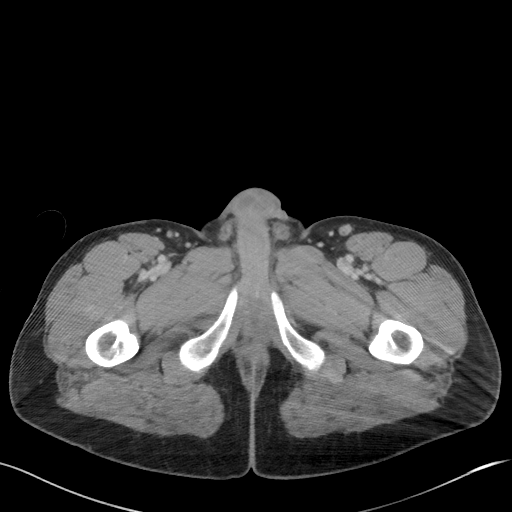
[im 16/137  bone]
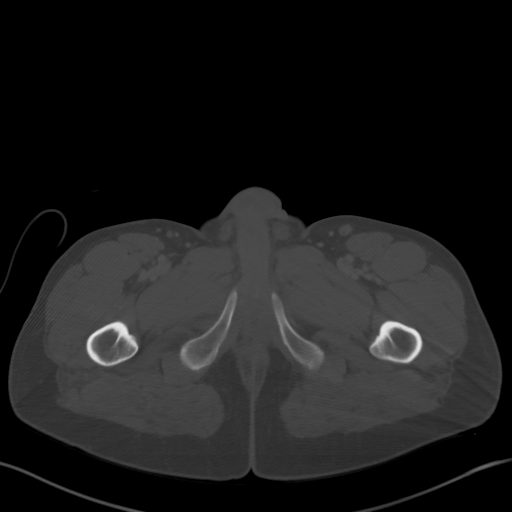
[im 31/137  soft-tissue]
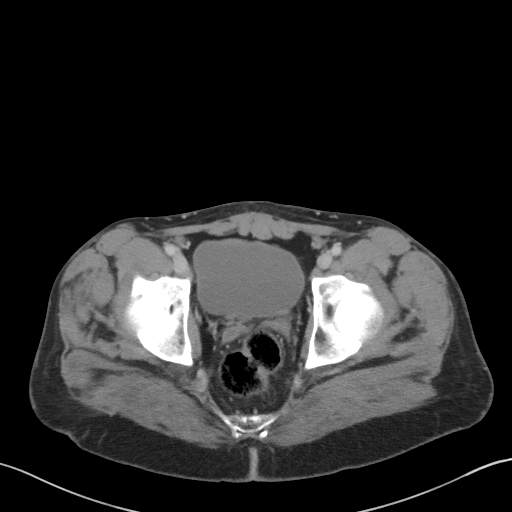
[im 46/137  soft-tissue]
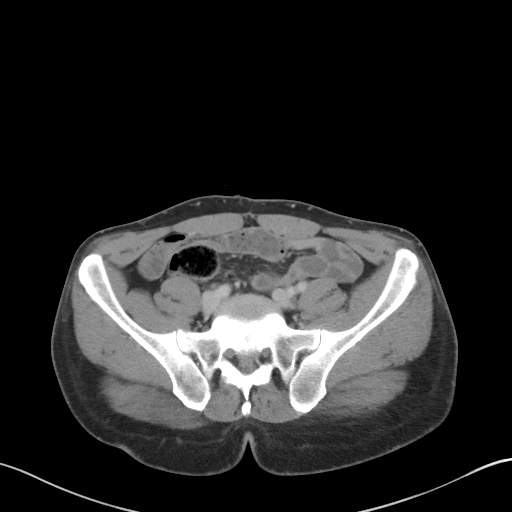
[im 61/137  soft-tissue]
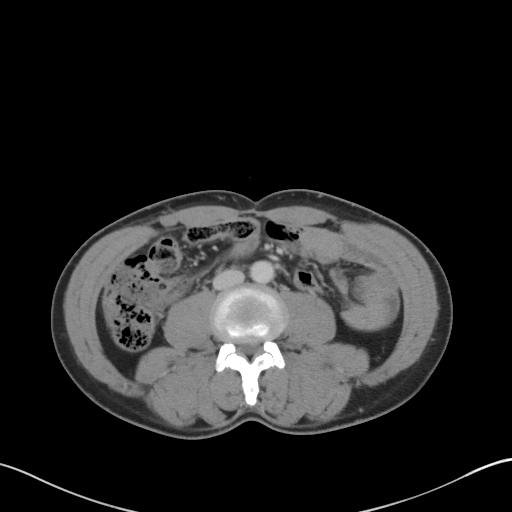
[im 76/137  soft-tissue]
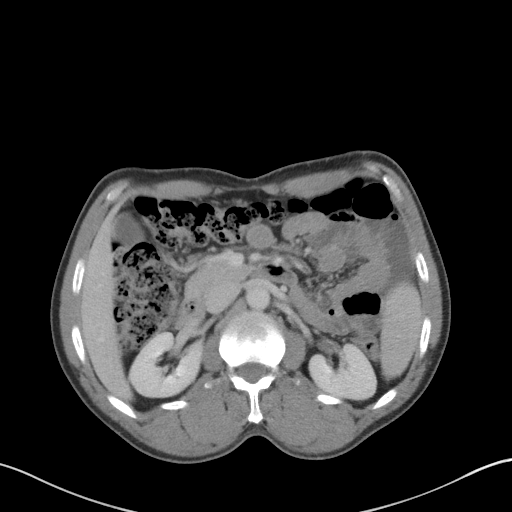
[im 76/137  lung]
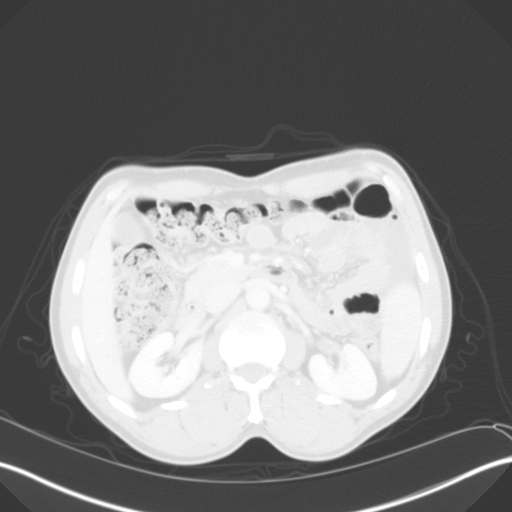
[im 91/137  soft-tissue]
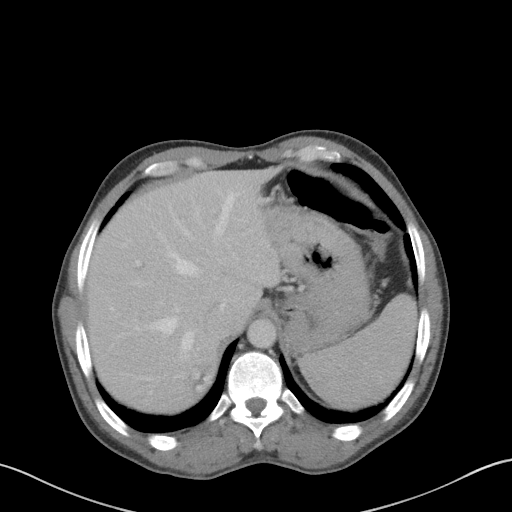
[im 91/137  lung]
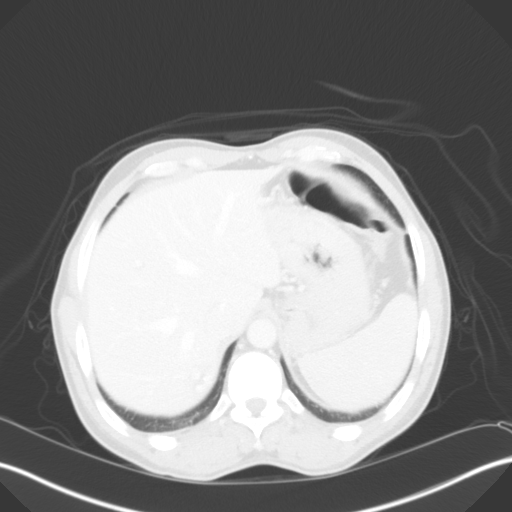
[im 106/137  soft-tissue]
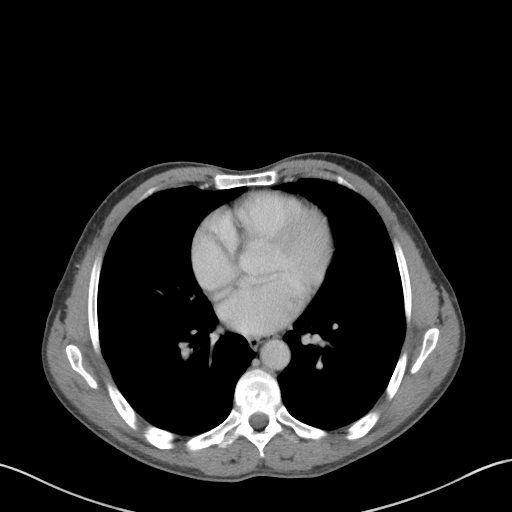
[im 106/137  lung]
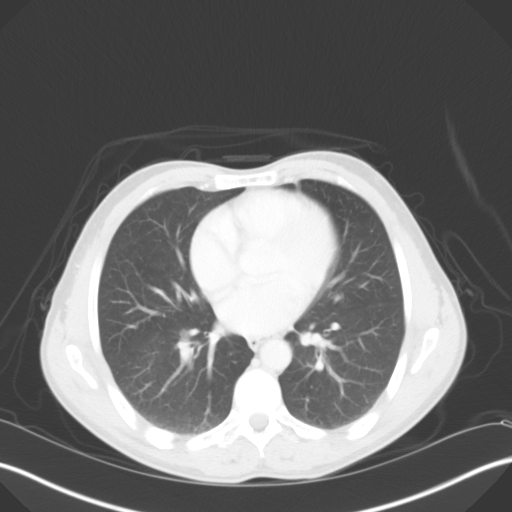
[im 121/137  soft-tissue]
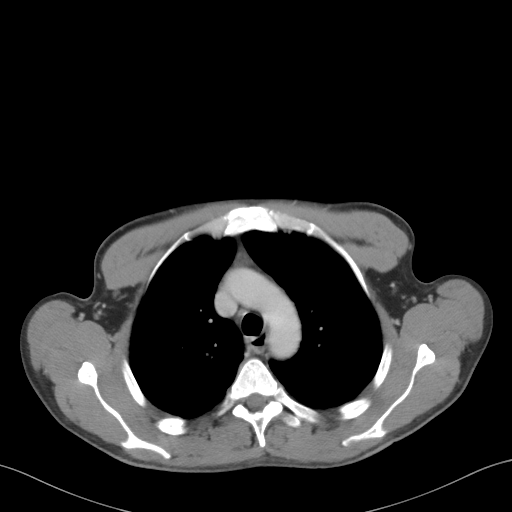
[im 121/137  lung]
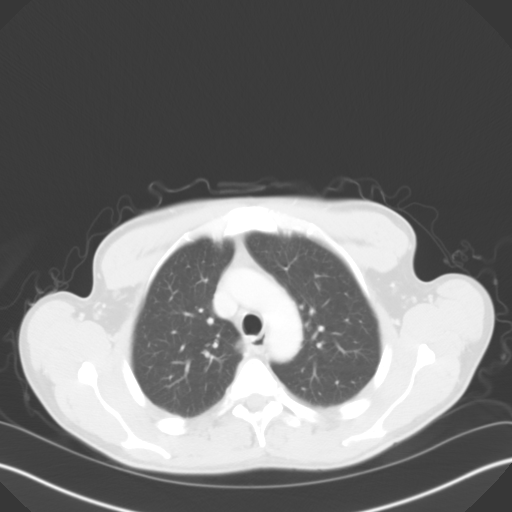

[11 of 46 positions shown; findings below may reference images not displayed]

FINDINGS: CT CHEST FINDINGS

Cardiovascular: Heart size is normal. There is no significant
pericardial fluid, thickening or pericardial calcification. No
atherosclerotic calcifications in the thoracic aorta or the coronary
arteries.

Mediastinum/Nodes: No pathologically enlarged mediastinal or hilar
lymph nodes. Esophagus is unremarkable in appearance. No axillary
lymphadenopathy.

Lungs/Pleura: No suspicious appearing pulmonary nodules or masses
are noted. No acute consolidative airspace disease. No pleural
effusions.

Musculoskeletal: There are no aggressive appearing lytic or blastic
lesions noted in the visualized portions of the skeleton.

CT ABDOMEN PELVIS FINDINGS

Hepatobiliary: Multiple low-attenuation lesions are noted in the
liver which demonstrate peripheral early nodular hyperenhancement
with progressive centripetal filling, diagnostic of cavernous
hemangiomas, largest of which is in segment 2 measuring 2.4 x
cm. No suspicious hepatic lesions. No intra or extrahepatic biliary
ductal dilatation. Gallbladder is normal in appearance.

Pancreas: No pancreatic mass. No pancreatic ductal dilatation. No
pancreatic or peripancreatic fluid collections or inflammatory
changes.

Spleen: Unremarkable.

Adrenals/Urinary Tract: Bilateral kidneys and adrenal glands are
normal in appearance. No hydroureteronephrosis. Urinary bladder is
normal in appearance.

Stomach/Bowel: Normal appearance of the stomach. No pathologic
dilatation of small bowel or colon. The appendix is mildly dilated
measuring 1 cm in diameter and demonstrates some hyperemia of the
mucosa. No definite surrounding inflammatory changes.

Vascular/Lymphatic: Atherosclerotic calcifications in the pelvic
vasculature. No aneurysm or dissection in the abdominal or pelvic
vasculature. No lymphadenopathy noted in the abdomen or pelvis.

Reproductive: Prostate gland and seminal vesicles are unremarkable
in appearance.

Other: No significant volume of ascites.  No pneumoperitoneum.

Musculoskeletal: There are no aggressive appearing lytic or blastic
lesions noted in the visualized portions of the skeleton.
IMPRESSION: 1. Appendix appears borderline dilated and demonstrates hyperemia.
Although there are no overt surrounding inflammatory changes,
clinical correlation for signs and symptoms of acute appendicitis is
suggested.
2. No other acute findings are noted elsewhere in the chest, abdomen
or pelvis.
3. Multiple cavernous hemangiomas in the liver incidentally noted.
4. Atherosclerosis.
